# Patient Record
Sex: Female | Born: 1960 | Race: White | Hispanic: No | Marital: Married | Smoking: Never smoker
Health system: Southern US, Community
[De-identification: ages and names within clinical notes are randomized; demographics above are authoritative.]

## PROBLEM LIST (undated history)

## (undated) DIAGNOSIS — Q613 Polycystic kidney, unspecified: Secondary | ICD-10-CM

---

## 1998-10-31 ENCOUNTER — Ambulatory Visit (HOSPITAL_COMMUNITY): Admission: RE | Admit: 1998-10-31 | Discharge: 1998-10-31 | Payer: Self-pay | Admitting: Obstetrics and Gynecology

## 1998-10-31 ENCOUNTER — Encounter: Payer: Self-pay | Admitting: Obstetrics and Gynecology

## 1999-11-08 ENCOUNTER — Encounter: Payer: Self-pay | Admitting: Obstetrics and Gynecology

## 1999-11-08 ENCOUNTER — Ambulatory Visit (HOSPITAL_COMMUNITY): Admission: RE | Admit: 1999-11-08 | Discharge: 1999-11-08 | Payer: Self-pay | Admitting: Family Medicine

## 2000-11-18 ENCOUNTER — Encounter: Payer: Self-pay | Admitting: Obstetrics and Gynecology

## 2000-11-18 ENCOUNTER — Ambulatory Visit (HOSPITAL_COMMUNITY): Admission: RE | Admit: 2000-11-18 | Discharge: 2000-11-18 | Payer: Self-pay | Admitting: Obstetrics and Gynecology

## 2001-12-03 ENCOUNTER — Ambulatory Visit (HOSPITAL_COMMUNITY): Admission: RE | Admit: 2001-12-03 | Discharge: 2001-12-03 | Payer: Self-pay | Admitting: Obstetrics and Gynecology

## 2001-12-03 ENCOUNTER — Encounter: Payer: Self-pay | Admitting: Obstetrics and Gynecology

## 2003-02-01 ENCOUNTER — Ambulatory Visit (HOSPITAL_COMMUNITY): Admission: RE | Admit: 2003-02-01 | Discharge: 2003-02-01 | Payer: Self-pay | Admitting: Obstetrics and Gynecology

## 2003-02-01 ENCOUNTER — Encounter: Payer: Self-pay | Admitting: Obstetrics and Gynecology

## 2016-04-02 ENCOUNTER — Emergency Department (HOSPITAL_COMMUNITY): Payer: Managed Care, Other (non HMO)

## 2016-04-02 ENCOUNTER — Encounter (HOSPITAL_COMMUNITY): Payer: Self-pay | Admitting: *Deleted

## 2016-04-02 ENCOUNTER — Emergency Department (HOSPITAL_COMMUNITY)
Admission: EM | Admit: 2016-04-02 | Discharge: 2016-04-02 | Disposition: A | Discharge status: Home / Self Care | Payer: Managed Care, Other (non HMO) | Attending: Emergency Medicine | Admitting: Emergency Medicine

## 2016-04-02 DIAGNOSIS — R310 Gross hematuria: Secondary | ICD-10-CM | POA: Diagnosis not present

## 2016-04-02 DIAGNOSIS — R319 Hematuria, unspecified: Secondary | ICD-10-CM | POA: Diagnosis present

## 2016-04-02 HISTORY — DX: Polycystic kidney, unspecified: Q61.3

## 2016-04-02 LAB — CBC WITH DIFFERENTIAL/PLATELET
Basophils Absolute: 0 10*3/uL (ref 0.0–0.1)
Basophils Relative: 0 %
Eosinophils Absolute: 0 10*3/uL (ref 0.0–0.7)
Eosinophils Relative: 0 %
HEMATOCRIT: 43.3 % (ref 36.0–46.0)
HEMOGLOBIN: 14.2 g/dL (ref 12.0–15.0)
LYMPHS ABS: 1.2 10*3/uL (ref 0.7–4.0)
Lymphocytes Relative: 15 %
MCH: 31.4 pg (ref 26.0–34.0)
MCHC: 32.8 g/dL (ref 30.0–36.0)
MCV: 95.8 fL (ref 78.0–100.0)
MONOS PCT: 11 %
Monocytes Absolute: 0.9 10*3/uL (ref 0.1–1.0)
NEUTROS ABS: 6.1 10*3/uL (ref 1.7–7.7)
NEUTROS PCT: 74 %
Platelets: 220 10*3/uL (ref 150–400)
RBC: 4.52 MIL/uL (ref 3.87–5.11)
RDW: 12.7 % (ref 11.5–15.5)
WBC: 8.3 10*3/uL (ref 4.0–10.5)

## 2016-04-02 LAB — COMPREHENSIVE METABOLIC PANEL
ALK PHOS: 69 U/L (ref 38–126)
ALT: 18 U/L (ref 14–54)
ANION GAP: 8 (ref 5–15)
AST: 32 U/L (ref 15–41)
Albumin: 4.1 g/dL (ref 3.5–5.0)
BILIRUBIN TOTAL: 1.6 mg/dL — AB (ref 0.3–1.2)
BUN: 16 mg/dL (ref 6–20)
CALCIUM: 9.7 mg/dL (ref 8.9–10.3)
CO2: 27 mmol/L (ref 22–32)
Chloride: 104 mmol/L (ref 101–111)
Creatinine, Ser: 0.93 mg/dL (ref 0.44–1.00)
GLUCOSE: 97 mg/dL (ref 65–99)
Potassium: 3.8 mmol/L (ref 3.5–5.1)
Sodium: 139 mmol/L (ref 135–145)
TOTAL PROTEIN: 7.6 g/dL (ref 6.5–8.1)

## 2016-04-02 LAB — URINE MICROSCOPIC-ADD ON

## 2016-04-02 LAB — URINALYSIS, ROUTINE W REFLEX MICROSCOPIC
BILIRUBIN URINE: NEGATIVE
Glucose, UA: NEGATIVE mg/dL
KETONES UR: 15 mg/dL — AB
NITRITE: NEGATIVE
PH: 6.5 (ref 5.0–8.0)
Protein, ur: 100 mg/dL — AB
Specific Gravity, Urine: 1.01 (ref 1.005–1.030)

## 2016-04-02 LAB — LIPASE, BLOOD: Lipase: 21 U/L (ref 11–51)

## 2016-04-02 NOTE — ED Notes (Signed)
Pt is in stable condition upon d/c and ambulates from ED. 

## 2016-04-02 NOTE — ED Provider Notes (Signed)
[facility]-EMERGENCY DEPT Provider Note   CSN: [id number] Arrival date & time: 04/02/16  0900   History   Chief Complaint Chief Complaint  Patient presents with  . Hematuria  . Flank Pain    HPI Meredith Mann is a 55 y.o. female who presents with R flank pain and hematuria for the past 2 days. PMH significant for polycystic kidney disease currently being followed by Dr. Elvis CoilMartin Webb. She states that she started to have the pain intermittently Saturday. Felt like a sharp pain and is now a dull, achy pain. Has taken Tylenol with some relief. She is reporting associated hematuria which started 2 days ago as well. She states it was pink in color but has now turned crimson. No clots. She is not on blood thinners. She called Dr. Marland McalpineWebb's office who recommended her to come to the ED for evaluation. Denies fever, chills, decreased appetite, chest pain, SOB, back pain, acute injury, N/V/D, melena/hematochezia, irritative voiding symptoms, vaginal discharge and bleeding. Denies hx of kidney stone. Has an IUD. Denies previous abdominal surgeries. She has had Renal US of her kidneys which showed the right has more cysts than left.  HPI  Past Medical History:  Diagnosis Date  . PKD (polycystic kidney disease)     There are no active problems to display for this patient.   History reviewed. No pertinent surgical history.  OB History    No data available       Home Medications    Prior to Admission medications   Medication Sig Start Date End Date Taking? Authorizing Provider  acetaminophen (TYLENOL) 500 MG tablet Take 500 mg by mouth every 6 (six) hours as needed for mild pain.   Yes Historical Provider, MD  calcium-vitamin D (OSCAL WITH D) 500-200 MG-UNIT tablet Take 1 tablet by mouth daily with breakfast.   Yes Historical Provider, MD  Multiple Vitamin (MULTIVITAMIN) tablet Take 1 tablet by mouth daily.   Yes Historical Provider, MD  naproxen (NAPROSYN) 250 MG tablet Take 250 mg by mouth  daily as needed for mild pain.   Yes Historical Provider, MD    Family History History reviewed. No pertinent family history.  Social History Social History  Substance Use Topics  . Smoking status: Never Smoker  . Smokeless tobacco: Never Used  . Alcohol use No     Allergies   Review of patient's allergies indicates no known allergies.   Review of Systems Review of Systems  Constitutional: Negative for chills and fever.  Respiratory: Negative for shortness of breath.   Cardiovascular: Negative for chest pain.  Gastrointestinal: Negative for abdominal pain, constipation, diarrhea, nausea and vomiting.  Genitourinary: Positive for flank pain and hematuria. Negative for difficulty urinating, dysuria, frequency, pelvic pain, urgency, vaginal bleeding and vaginal discharge.  Musculoskeletal: Negative for back pain.  All other systems reviewed and are negative.    Physical Exam Updated Vital Signs BP 111/62   Pulse 70   Temp 97.9 F (36.6 C) (Oral)   Resp 18   LMP 04/02/2012 (Approximate)   SpO2 99%   Physical Exam  Constitutional: She is oriented to person, place, and time. She appears well-developed and well-nourished. No distress.  HENT:  Head: Normocephalic and atraumatic.  Eyes: Conjunctivae are normal. Pupils are equal, round, and reactive to light. Right eye exhibits no discharge. Left eye exhibits no discharge. No scleral icterus.  Neck: Normal range of motion. Neck supple.  Cardiovascular: Normal rate and regular rhythm.  Exam reveals no gallop and no  friction rub.   No murmur heard. Pulmonary/Chest: Effort normal and breath sounds normal. No respiratory distress. She has no wheezes. She has no rales. She exhibits no tenderness.  Abdominal: Soft. Bowel sounds are normal. She exhibits no distension and no mass. There is tenderness. There is no rebound and no guarding. No hernia.  R CVA tenderness; right middle abdominal tenderness  Musculoskeletal: She exhibits  no edema.  Neurological: She is alert and oriented to person, place, and time.  Skin: Skin is warm and dry.  Psychiatric: She has a normal mood and affect. Her behavior is normal.  Nursing note and vitals reviewed.    ED Treatments / Results  Labs (all labs ordered are listed, but only abnormal results are displayed) Labs Reviewed  COMPREHENSIVE METABOLIC PANEL - Abnormal; Notable for the following:       Result Value   Total Bilirubin 1.6 (*)    All other components within normal limits  URINALYSIS, ROUTINE W REFLEX MICROSCOPIC (NOT AT [facility]) - Abnormal; Notable for the following:    Color, Urine RED (*)    APPearance CLOUDY (*)    Hgb urine dipstick LARGE (*)    Ketones, ur 15 (*)    Protein, ur 100 (*)    Leukocytes, UA MODERATE (*)    All other components within normal limits  URINE MICROSCOPIC-ADD ON - Abnormal; Notable for the following:    Squamous Epithelial / LPF 0-5 (*)    Bacteria, UA FEW (*)    All other components within normal limits  URINE CULTURE  CBC WITH DIFFERENTIAL/PLATELET  LIPASE, BLOOD    EKG  EKG Interpretation None       Radiology Ct Renal Stone Study  Result Date: 04/02/2016 CLINICAL DATA:  Right flank pain for 3 days. Gross hematuria. History of polycystic kidney disease. EXAM: CT ABDOMEN AND PELVIS WITHOUT CONTRAST TECHNIQUE: Multidetector CT imaging of the abdomen and pelvis was performed following the standard protocol without IV contrast. COMPARISON:  None. FINDINGS: Mild dependent atelectasis is seen in the lung bases. No pleural or pericardial effusion. Multiple bilateral renal cysts are identified. The largest is off the lower pole of the right kidney and measures 9.1 cm transverse by 6.9 cm AP by 9.1 cm craniocaudal. A prominent cyst in the mid pole of the right kidney has some rim calcification. No hydronephrosis or ureteral stone is seen on the right or left. The urinary bladder is unremarkable. IUD is in place in the uterus. The  gallbladder, liver, spleen, adrenal glands and pancreas appear normal. The stomach and small and large bowel appear normal. There is no evidence of appendicitis. Loss of disc space height and disc bulging with annular calcifications are seen at L4-5 and L5-S1. No lytic or sclerotic bony lesions are identified. IMPRESSION: Negative for hydronephrosis or acute abnormality. Multiple bilateral renal cysts, larger and more numerous on the right. Lower lumbar spondylosis. Electronically Signed   By: Drusilla Kanner M.D.   On: 04/02/2016 11:00    Procedures Procedures (including critical care time)  Medications Ordered in ED Medications - No data to display   Initial Impression / Assessment and Plan / ED Course  I have reviewed the triage vital signs and the nursing notes.  Pertinent labs & imaging results that were available during my care of the patient were reviewed by me and considered in my medical decision making (see chart for details).  Clinical Course   55 year old female presents with right sided pain and gross  hematuria most consistent with a ruptured renal cyst. Patient is afebrile, not tachycardic or tachypneic, normotensive, and not hypoxic. CBC and CMP are unremarkable. UA remarkable for few bacteria, red colored urine, large hgb with too numerous to count RBCs, 15 ketones, moderate leukocytes, 100 protein, and 6-30 WBCs. Culture sent. CT renal shows cysts but is otherwise unremarkable. Spoke with Dr. Briant CedarMattingly with nephrology who recommended follow up with Dr. Hyman HopesWebb. Results discussed with patient. Patient is NAD, non-toxic, with stable VS. Patient is informed of clinical course, understands medical decision making process, and agrees with plan. Opportunity for questions provided and all questions answered. Return precautions given.    Final Clinical Impressions(s) / ED Diagnoses   Final diagnoses:  Gross hematuria    New Prescriptions New Prescriptions   No medications on file       Bethel BornKelly Marie Osceola Depaz, PA-C 04/03/16 [id number]    Mancel BaleElliott Wentz, MD 04/03/16 1233

## 2016-04-02 NOTE — ED Triage Notes (Signed)
Pt reports onset of right side flank pain on Saturday. Reports blood in urine.

## 2016-04-03 LAB — URINE CULTURE: Culture: 50000 — AB

## 2016-04-04 ENCOUNTER — Telehealth (HOSPITAL_BASED_OUTPATIENT_CLINIC_OR_DEPARTMENT_OTHER): Payer: Self-pay | Admitting: Emergency Medicine

## 2016-04-04 NOTE — Telephone Encounter (Signed)
Post ED Visit - Positive Culture Follow-up  Culture report reviewed by antimicrobial stewardship pharmacist:  []  Enzo BiNathan Batchelder, Pharm.D. []  Celedonio MiyamotoJeremy Frens, Pharm.D., BCPS []  Garvin FilaMike Maccia, Pharm.D. []  Georgina PillionElizabeth Martin, Pharm.D., BCPS []  EdgarMinh Pham, [address]Ph[address]D., BCPS, AAHIVP []  Estella HuskMichelle Turner, Pharm.D., BCPS, AAHIVP []  Tennis Mustassie Stewart, Pharm.D. []  Sherle Poeob Vincent, [address]Ph[address] Vianne BullsKai Kong RPh  Positive urine culture Treated with none, asymptomatic, no further patient follow-up is required at this time.  Berle MullMiller, Yaritzel Stange 04/04/2016, 10:47 AM

## 2017-09-12 IMAGING — CT CT RENAL STONE PROTOCOL
2 of 4 series · 16 of 46 positions shown, 18 images · non-contrast
Comparison: None.

CLINICAL DATA: Right flank pain for 3 days. Gross hematuria.
History of polycystic kidney disease.

EXAM:
CT ABDOMEN AND PELVIS WITHOUT CONTRAST
TECHNIQUE: Multidetector CT imaging of the abdomen and pelvis was performed
following the standard protocol without IV contrast.

[Series 2: stone study 5.0 i30f 1 · axial · 0.68mm/px · z∈[+824,+1254]mm · 13 of 94 slices shown, 15 images]
[im 4/94  soft-tissue]
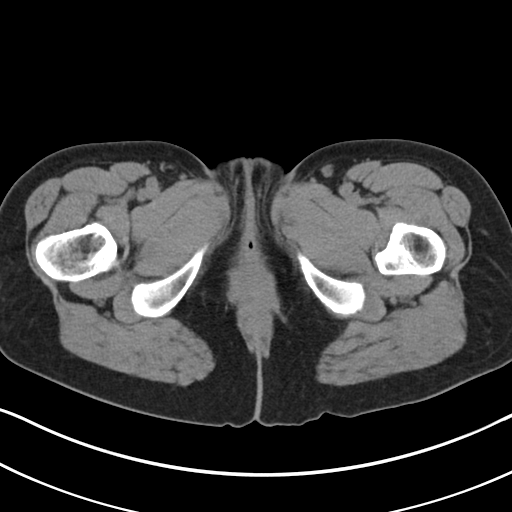
[im 4/94  bone]
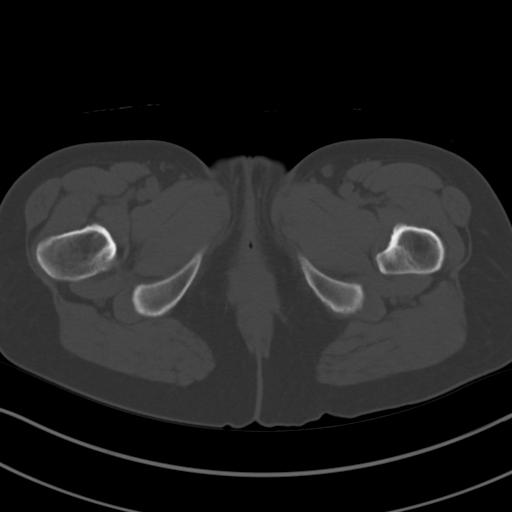
[im 12/94  soft-tissue]
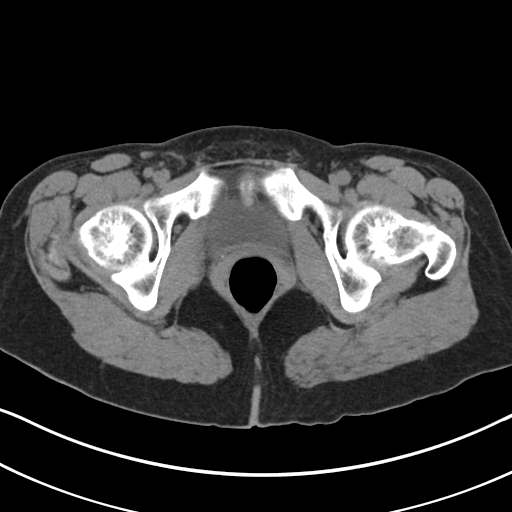
[im 19/94  soft-tissue]
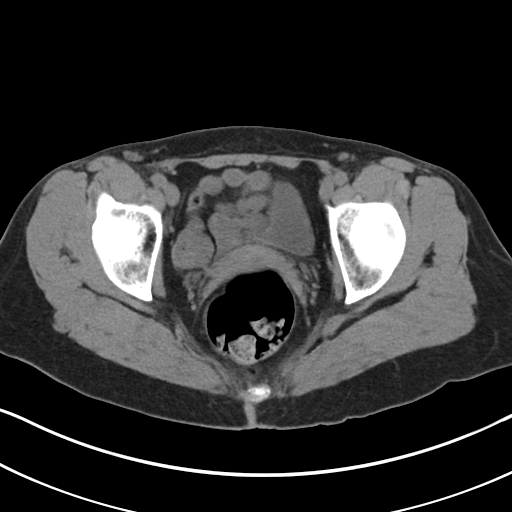
[im 27/94  soft-tissue]
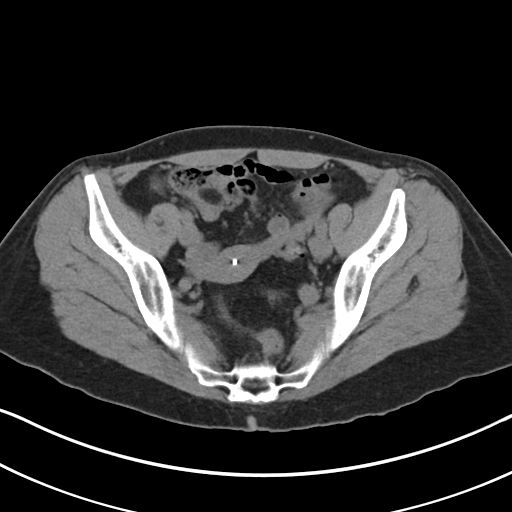
[im 34/94  soft-tissue]
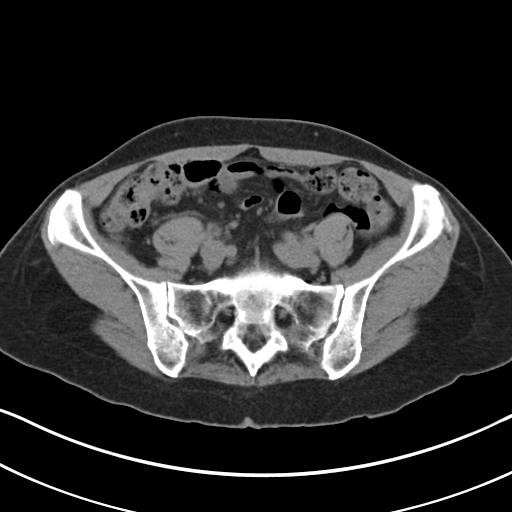
[im 41/94  soft-tissue]
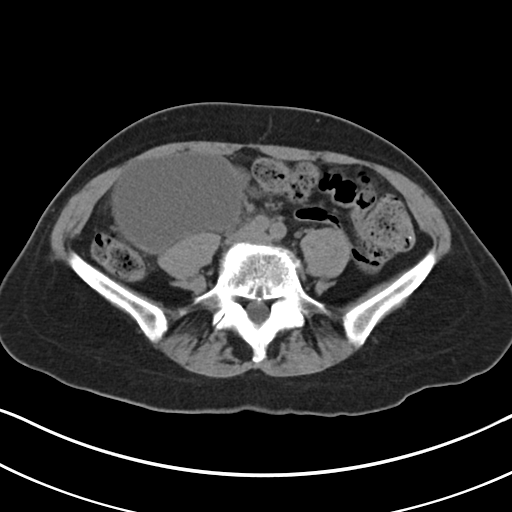
[im 49/94  soft-tissue]
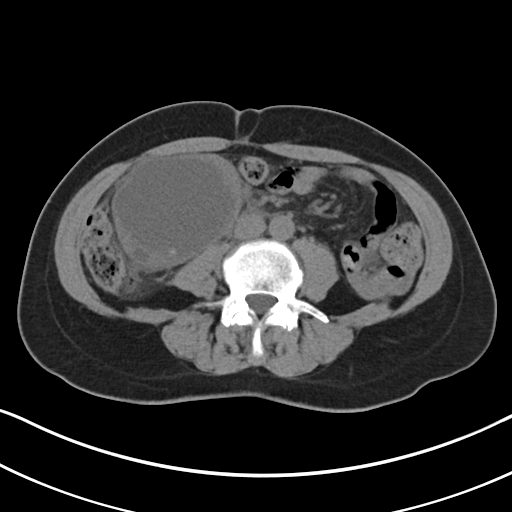
[im 53/94  soft-tissue]
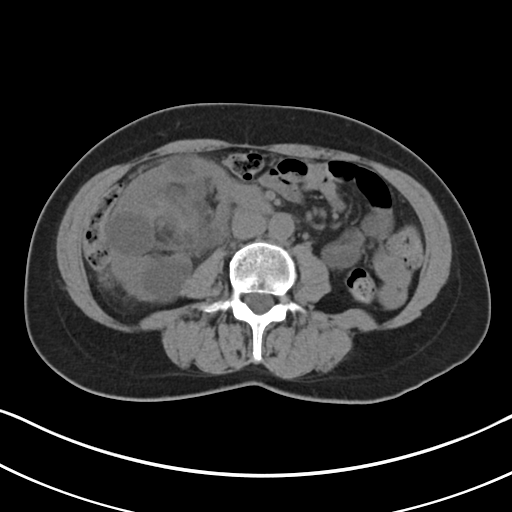
[im 60/94  soft-tissue]
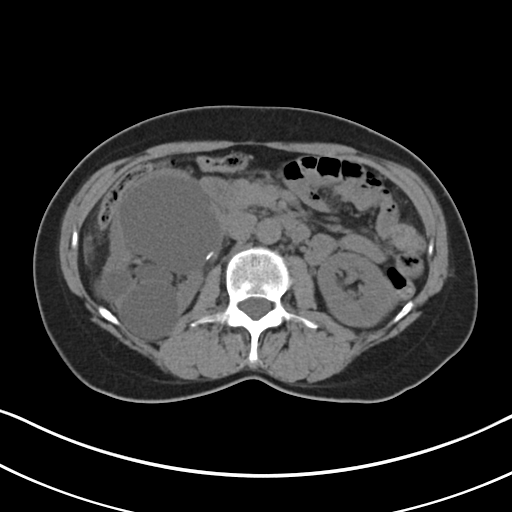
[im 60/94  bone]
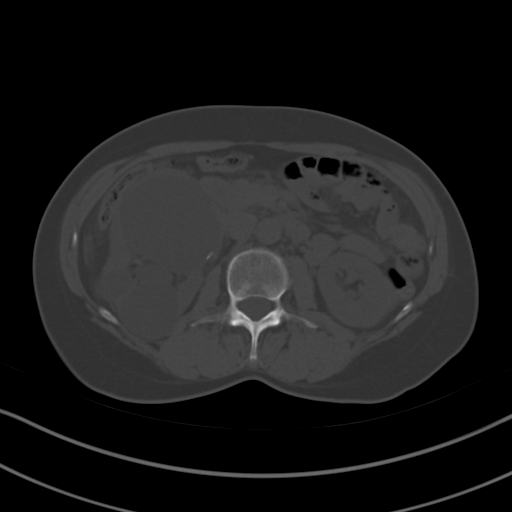
[im 67/94  soft-tissue]
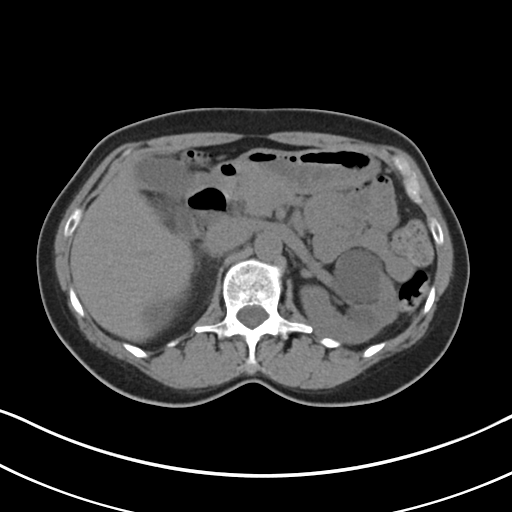
[im 75/94  soft-tissue]
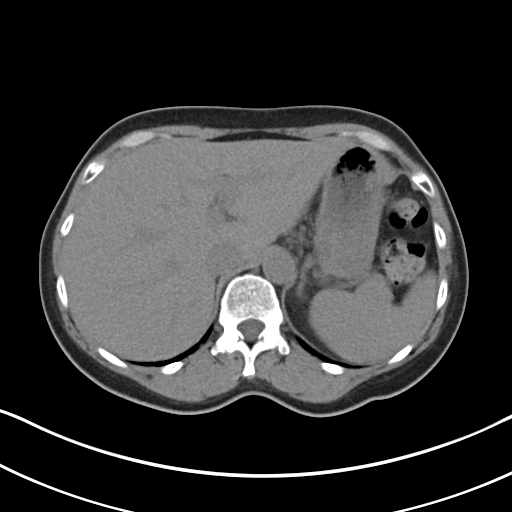
[im 82/94  soft-tissue]
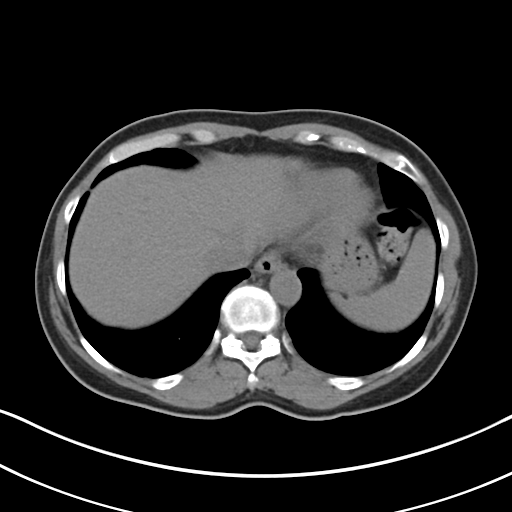
[im 90/94  soft-tissue]
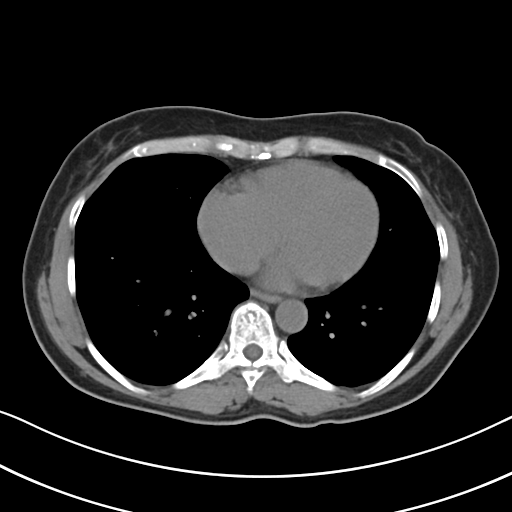

[Series 5: coronal soft tissue · coronal · 0.72mm/px · 3 of 97 slices shown]
[im 33/97  soft-tissue]
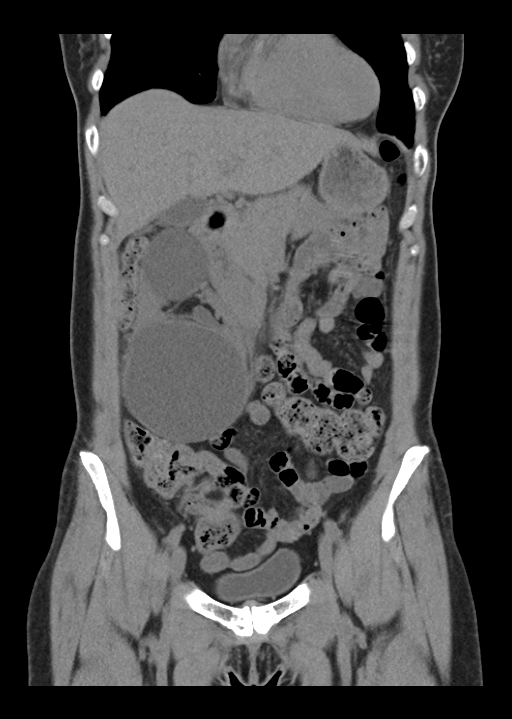
[im 43/97  soft-tissue]
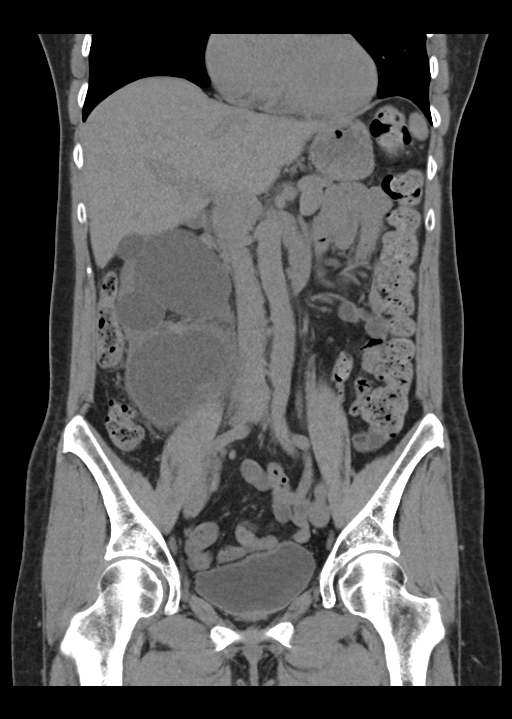
[im 54/97  soft-tissue]
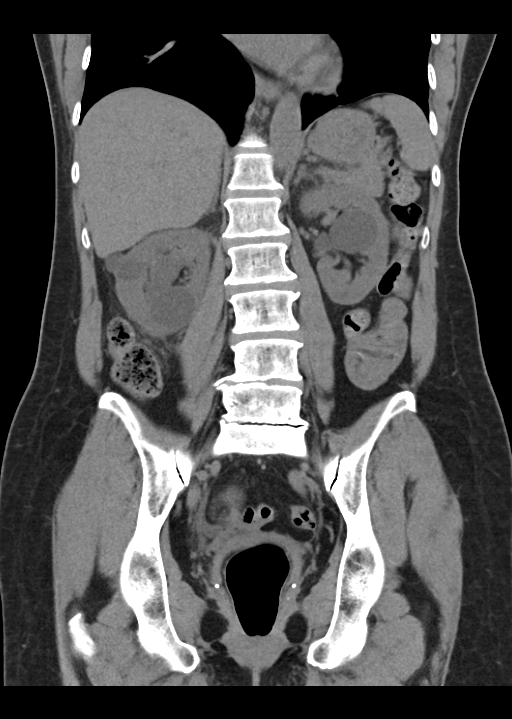

[16 of 46 positions shown; findings below may reference images not displayed]

FINDINGS: Mild dependent atelectasis is seen in the lung bases. No pleural or
pericardial effusion.

Multiple bilateral renal cysts are identified. The largest is off
the lower pole of the right kidney and measures 9.1 cm transverse by
6.9 cm AP by 9.1 cm craniocaudal. A prominent cyst in the mid pole
of the right kidney has some rim calcification. No hydronephrosis or
ureteral stone is seen on the right or left. The urinary bladder is
unremarkable. IUD is in place in the uterus.

The gallbladder, liver, spleen, adrenal glands and pancreas appear
normal. The stomach and small and large bowel appear normal. There
is no evidence of appendicitis.

Loss of disc space height and disc bulging with annular
calcifications are seen at L4-5 and L5-S1. No lytic or sclerotic
bony lesions are identified.
IMPRESSION: Negative for hydronephrosis or acute abnormality.

Multiple bilateral renal cysts, larger and more numerous on the
right.

Lower lumbar spondylosis.

## 2019-05-21 ENCOUNTER — Other Ambulatory Visit: Payer: Self-pay | Admitting: Nephrology

## 2019-05-21 DIAGNOSIS — N281 Cyst of kidney, acquired: Secondary | ICD-10-CM

## 2019-05-21 DIAGNOSIS — Q613 Polycystic kidney, unspecified: Secondary | ICD-10-CM

## 2019-05-28 ENCOUNTER — Ambulatory Visit
Admission: RE | Admit: 2019-05-28 | Discharge: 2019-05-28 | Disposition: A | Discharge status: Home / Self Care | Payer: 59 | Source: Ambulatory Visit | Attending: Nephrology | Admitting: Nephrology

## 2019-05-28 DIAGNOSIS — N281 Cyst of kidney, acquired: Secondary | ICD-10-CM

## 2019-05-28 DIAGNOSIS — Q613 Polycystic kidney, unspecified: Secondary | ICD-10-CM

## 2020-07-21 ENCOUNTER — Other Ambulatory Visit: Payer: Self-pay | Admitting: Nephrology

## 2020-07-21 DIAGNOSIS — Q613 Polycystic kidney, unspecified: Secondary | ICD-10-CM

## 2020-08-16 ENCOUNTER — Ambulatory Visit
Admission: RE | Admit: 2020-08-16 | Discharge: 2020-08-16 | Disposition: A | Discharge status: Home / Self Care | Payer: 59 | Source: Ambulatory Visit | Attending: Nephrology | Admitting: Nephrology

## 2020-08-16 DIAGNOSIS — Q613 Polycystic kidney, unspecified: Secondary | ICD-10-CM

## 2021-09-11 ENCOUNTER — Other Ambulatory Visit: Payer: Self-pay | Admitting: Nephrology

## 2021-09-11 DIAGNOSIS — N281 Cyst of kidney, acquired: Secondary | ICD-10-CM

## 2022-07-31 ENCOUNTER — Other Ambulatory Visit: Payer: Self-pay | Admitting: Obstetrics and Gynecology

## 2022-07-31 DIAGNOSIS — M8580 Other specified disorders of bone density and structure, unspecified site: Secondary | ICD-10-CM

## 2022-07-31 DIAGNOSIS — E041 Nontoxic single thyroid nodule: Secondary | ICD-10-CM

## 2022-08-15 ENCOUNTER — Ambulatory Visit
Admission: RE | Admit: 2022-08-15 | Discharge: 2022-08-15 | Disposition: A | Discharge status: Home / Self Care | Payer: BC Managed Care – PPO | Source: Ambulatory Visit | Attending: Obstetrics and Gynecology | Admitting: Obstetrics and Gynecology

## 2022-08-15 DIAGNOSIS — E041 Nontoxic single thyroid nodule: Secondary | ICD-10-CM

## 2023-01-24 ENCOUNTER — Ambulatory Visit
Admission: RE | Admit: 2023-01-24 | Discharge: 2023-01-24 | Disposition: A | Discharge status: Home / Self Care | Payer: BC Managed Care – PPO | Source: Ambulatory Visit | Attending: Obstetrics and Gynecology | Admitting: Obstetrics and Gynecology

## 2023-01-24 DIAGNOSIS — M8580 Other specified disorders of bone density and structure, unspecified site: Secondary | ICD-10-CM

## 2023-09-03 ENCOUNTER — Other Ambulatory Visit: Payer: Self-pay | Admitting: Internal Medicine

## 2023-09-03 DIAGNOSIS — Q613 Polycystic kidney, unspecified: Secondary | ICD-10-CM

## 2023-09-05 ENCOUNTER — Ambulatory Visit
Admission: RE | Admit: 2023-09-05 | Discharge: 2023-09-05 | Disposition: A | Discharge status: Home / Self Care | Payer: BC Managed Care – PPO | Source: Ambulatory Visit | Attending: Internal Medicine | Admitting: Internal Medicine

## 2023-09-05 ENCOUNTER — Other Ambulatory Visit: Payer: 59

## 2023-09-05 DIAGNOSIS — Q613 Polycystic kidney, unspecified: Secondary | ICD-10-CM
